# Patient Record
Sex: Female | Born: 1980 | Hispanic: Yes | Marital: Single | Smoking: Never smoker
Health system: Southern US, Community
[De-identification: ages and names within clinical notes are randomized; demographics above are authoritative.]

## PROBLEM LIST (undated history)

## (undated) DIAGNOSIS — E785 Hyperlipidemia, unspecified: Secondary | ICD-10-CM

## (undated) HISTORY — DX: Hyperlipidemia, unspecified: E78.5

---

## 2009-04-30 ENCOUNTER — Ambulatory Visit: Payer: Self-pay | Admitting: Family Medicine

## 2009-09-23 ENCOUNTER — Inpatient Hospital Stay: Payer: Self-pay | Admitting: Obstetrics and Gynecology

## 2009-10-18 ENCOUNTER — Ambulatory Visit: Payer: Self-pay | Admitting: Family Medicine

## 2009-10-24 ENCOUNTER — Ambulatory Visit: Payer: Self-pay

## 2009-11-06 ENCOUNTER — Ambulatory Visit: Payer: Self-pay | Admitting: General Surgery

## 2009-11-08 ENCOUNTER — Ambulatory Visit: Payer: Self-pay | Admitting: General Surgery

## 2010-07-11 ENCOUNTER — Ambulatory Visit: Payer: Self-pay | Admitting: Family

## 2010-11-29 ENCOUNTER — Ambulatory Visit: Payer: Self-pay | Admitting: Family

## 2010-12-07 ENCOUNTER — Inpatient Hospital Stay: Payer: Self-pay | Admitting: Obstetrics and Gynecology

## 2012-01-20 ENCOUNTER — Ambulatory Visit: Payer: Self-pay

## 2016-09-15 NOTE — L&D Delivery Note (Signed)
Delivery Note At 7:21 AM a viable and female  was delivered via Vaginal, Spontaneous Delivery (Presentation: vtx/ ROA;  ).  APGAR: 9, 9; weight 9 lb 3.1 oz (4170 g).   Placenta status: , .  Cord:  with the following complications: .    Anesthesia:stadol  Episiotomy:  None  Lacerations:  None  Suture Repair: n/a Est. Blood Loss (mL):  200cc  Mom to postpartum.  Baby to Couplet care / Skin to Skin.  Ihor Austinhomas J Sokhna Christoph 07/02/2017, 7:42 AM

## 2016-12-25 ENCOUNTER — Other Ambulatory Visit: Payer: Self-pay | Admitting: Primary Care

## 2016-12-25 DIAGNOSIS — Z3482 Encounter for supervision of other normal pregnancy, second trimester: Secondary | ICD-10-CM

## 2016-12-31 ENCOUNTER — Encounter: Payer: Self-pay | Admitting: Radiology

## 2016-12-31 ENCOUNTER — Ambulatory Visit
Admission: RE | Admit: 2016-12-31 | Discharge: 2016-12-31 | Disposition: A | Discharge status: Home / Self Care | Payer: Self-pay | Source: Ambulatory Visit | Attending: Primary Care | Admitting: Primary Care

## 2016-12-31 ENCOUNTER — Other Ambulatory Visit: Payer: Self-pay | Admitting: Primary Care

## 2016-12-31 DIAGNOSIS — Z3A17 17 weeks gestation of pregnancy: Secondary | ICD-10-CM | POA: Insufficient documentation

## 2016-12-31 DIAGNOSIS — Z3482 Encounter for supervision of other normal pregnancy, second trimester: Secondary | ICD-10-CM | POA: Insufficient documentation

## 2017-07-02 ENCOUNTER — Inpatient Hospital Stay
Admission: EM | Admit: 2017-07-02 | Discharge: 2017-07-04 | DRG: 807 | Disposition: A | Discharge status: Home / Self Care | Payer: Medicaid Other | Attending: Obstetrics and Gynecology | Admitting: Obstetrics and Gynecology

## 2017-07-02 DIAGNOSIS — O479 False labor, unspecified: Secondary | ICD-10-CM | POA: Diagnosis present

## 2017-07-02 DIAGNOSIS — Z3A40 40 weeks gestation of pregnancy: Secondary | ICD-10-CM

## 2017-07-02 DIAGNOSIS — O99824 Streptococcus B carrier state complicating childbirth: Principal | ICD-10-CM | POA: Diagnosis present

## 2017-07-02 DIAGNOSIS — O26893 Other specified pregnancy related conditions, third trimester: Secondary | ICD-10-CM | POA: Diagnosis present

## 2017-07-02 LAB — CBC WITH DIFFERENTIAL/PLATELET
BASOS ABS: 0 10*3/uL (ref 0–0.1)
Basophils Relative: 0 %
EOS ABS: 0.1 10*3/uL (ref 0–0.7)
EOS PCT: 1 %
HCT: 41.8 % (ref 35.0–47.0)
Hemoglobin: 14.1 g/dL (ref 12.0–16.0)
Lymphocytes Relative: 29 %
Lymphs Abs: 2.8 10*3/uL (ref 1.0–3.6)
MCH: 30.8 pg (ref 26.0–34.0)
MCHC: 33.8 g/dL (ref 32.0–36.0)
MCV: 91.1 fL (ref 80.0–100.0)
Monocytes Absolute: 0.8 10*3/uL (ref 0.2–0.9)
Monocytes Relative: 8 %
Neutro Abs: 6.1 10*3/uL (ref 1.4–6.5)
Neutrophils Relative %: 62 %
PLATELETS: 168 10*3/uL (ref 150–440)
RBC: 4.6 MIL/uL (ref 3.80–5.20)
RDW: 14.4 % (ref 11.5–14.5)
WBC: 9.8 10*3/uL (ref 3.6–11.0)

## 2017-07-02 LAB — TYPE AND SCREEN
ABO/RH(D): O POS
Antibody Screen: NEGATIVE

## 2017-07-02 MED ORDER — ACETAMINOPHEN 325 MG PO TABS
650.0000 mg | ORAL_TABLET | ORAL | Status: DC | PRN
  Administered 2017-07-02 – 2017-07-03 (×4): 650 mg via ORAL
  Filled 2017-07-02 (×2): qty 2

## 2017-07-02 MED ORDER — PRENATAL MULTIVITAMIN CH
1.0000 | ORAL_TABLET | Freq: Every day | ORAL | Status: DC
  Administered 2017-07-02 – 2017-07-03 (×2): 1 via ORAL
  Filled 2017-07-02 (×2): qty 1

## 2017-07-02 MED ORDER — LIDOCAINE HCL (PF) 1 % IJ SOLN
INTRAMUSCULAR | Status: AC
  Filled 2017-07-02: qty 30

## 2017-07-02 MED ORDER — ACETAMINOPHEN 325 MG PO TABS
650.0000 mg | ORAL_TABLET | ORAL | Status: DC | PRN
  Filled 2017-07-02 (×2): qty 2

## 2017-07-02 MED ORDER — WITCH HAZEL-GLYCERIN EX PADS
1.0000 | MEDICATED_PAD | CUTANEOUS | Status: DC | PRN
Start: 2017-07-02 — End: 2017-07-04

## 2017-07-02 MED ORDER — SODIUM CHLORIDE 0.9 % IV SOLN
2.0000 g | Freq: Once | INTRAVENOUS | Status: AC
  Administered 2017-07-02: 2 g via INTRAVENOUS

## 2017-07-02 MED ORDER — BUTORPHANOL TARTRATE 1 MG/ML IJ SOLN
1.0000 mg | INTRAMUSCULAR | Status: DC | PRN
  Administered 2017-07-02: 1 mg via INTRAVENOUS
  Filled 2017-07-02: qty 1

## 2017-07-02 MED ORDER — SODIUM CHLORIDE 0.9 % IV SOLN
1.0000 g | INTRAVENOUS | Status: DC
Start: 2017-07-02 — End: 2017-07-02
  Filled 2017-07-02 (×4): qty 1000

## 2017-07-02 MED ORDER — OXYTOCIN BOLUS FROM INFUSION
500.0000 mL | Freq: Once | INTRAVENOUS | Status: AC
  Administered 2017-07-02: 500 mL via INTRAVENOUS

## 2017-07-02 MED ORDER — DIPHENHYDRAMINE HCL 25 MG PO CAPS: 25.0000 mg | ORAL_CAPSULE | Freq: Four times a day (QID) | ORAL | Status: DC | PRN

## 2017-07-02 MED ORDER — ZOLPIDEM TARTRATE 5 MG PO TABS: 5.0000 mg | ORAL_TABLET | Freq: Every evening | ORAL | Status: DC | PRN

## 2017-07-02 MED ORDER — LACTATED RINGERS IV SOLN
INTRAVENOUS | Status: DC
  Administered 2017-07-02: 04:00:00 via INTRAVENOUS

## 2017-07-02 MED ORDER — AMMONIA AROMATIC IN INHA
RESPIRATORY_TRACT | Status: AC
  Filled 2017-07-02: qty 10

## 2017-07-02 MED ORDER — MEASLES, MUMPS & RUBELLA VAC ~~LOC~~ INJ
0.5000 mL | INJECTION | Freq: Once | SUBCUTANEOUS | Status: DC
  Filled 2017-07-02: qty 0.5

## 2017-07-02 MED ORDER — BENZOCAINE-MENTHOL 20-0.5 % EX AERO: 1.0000 "application " | INHALATION_SPRAY | CUTANEOUS | Status: DC | PRN

## 2017-07-02 MED ORDER — DIBUCAINE 1 % RE OINT: 1.0000 "application " | TOPICAL_OINTMENT | RECTAL | Status: DC | PRN

## 2017-07-02 MED ORDER — IBUPROFEN 600 MG PO TABS
600.0000 mg | ORAL_TABLET | Freq: Four times a day (QID) | ORAL | Status: DC
  Administered 2017-07-02 (×2): 600 mg via ORAL
  Filled 2017-07-02 (×3): qty 1

## 2017-07-02 MED ORDER — BUTORPHANOL TARTRATE 1 MG/ML IJ SOLN: 1.0000 mg | INTRAMUSCULAR | Status: DC | PRN

## 2017-07-02 MED ORDER — SENNOSIDES-DOCUSATE SODIUM 8.6-50 MG PO TABS
2.0000 | ORAL_TABLET | ORAL | Status: DC
  Administered 2017-07-03 – 2017-07-04 (×2): 2 via ORAL
  Filled 2017-07-02 (×2): qty 2

## 2017-07-02 MED ORDER — ONDANSETRON HCL 4 MG/2ML IJ SOLN: 4.0000 mg | INTRAMUSCULAR | Status: DC | PRN

## 2017-07-02 MED ORDER — OXYTOCIN 10 UNIT/ML IJ SOLN
INTRAMUSCULAR | Status: AC
  Filled 2017-07-02: qty 2

## 2017-07-02 MED ORDER — COCONUT OIL OIL
1.0000 "application " | TOPICAL_OIL | Status: DC | PRN
  Filled 2017-07-02: qty 120

## 2017-07-02 MED ORDER — IBUPROFEN 600 MG PO TABS
ORAL_TABLET | ORAL | Status: AC
  Administered 2017-07-02: 600 mg
  Filled 2017-07-02: qty 1

## 2017-07-02 MED ORDER — ONDANSETRON HCL 4 MG PO TABS: 4.0000 mg | ORAL_TABLET | ORAL | Status: DC | PRN

## 2017-07-02 MED ORDER — OXYTOCIN 40 UNITS IN LACTATED RINGERS INFUSION - SIMPLE MED
2.5000 [IU]/h | INTRAVENOUS | Status: DC
  Administered 2017-07-02: 2.5 [IU]/h via INTRAVENOUS
  Filled 2017-07-02: qty 1000

## 2017-07-02 MED ORDER — MISOPROSTOL 200 MCG PO TABS
ORAL_TABLET | ORAL | Status: AC
  Filled 2017-07-02: qty 4

## 2017-07-02 MED ORDER — MAGNESIUM HYDROXIDE 400 MG/5ML PO SUSP: 30.0000 mL | ORAL | Status: DC | PRN

## 2017-07-02 MED ORDER — SODIUM CHLORIDE 0.9 % IV SOLN
INTRAVENOUS | Status: AC
  Administered 2017-07-02: 2 g via INTRAVENOUS
  Filled 2017-07-02: qty 2000

## 2017-07-02 MED ORDER — LACTATED RINGERS IV SOLN: 500.0000 mL | INTRAVENOUS | Status: DC | PRN

## 2017-07-02 MED ORDER — FERROUS SULFATE 325 (65 FE) MG PO TABS
325.0000 mg | ORAL_TABLET | Freq: Two times a day (BID) | ORAL | Status: DC
  Administered 2017-07-02 – 2017-07-04 (×4): 325 mg via ORAL
  Filled 2017-07-02 (×4): qty 1

## 2017-07-02 MED ORDER — SOD CITRATE-CITRIC ACID 500-334 MG/5ML PO SOLN: 30.0000 mL | ORAL | Status: DC | PRN

## 2017-07-02 MED ORDER — LIDOCAINE HCL (PF) 1 % IJ SOLN: 30.0000 mL | INTRAMUSCULAR | Status: DC | PRN

## 2017-07-02 MED ORDER — LACTATED RINGERS IV SOLN: INTRAVENOUS | Status: DC

## 2017-07-02 MED ORDER — ONDANSETRON HCL 4 MG/2ML IJ SOLN: 4.0000 mg | Freq: Four times a day (QID) | INTRAMUSCULAR | Status: DC | PRN

## 2017-07-02 MED ORDER — SIMETHICONE 80 MG PO CHEW: 80.0000 mg | CHEWABLE_TABLET | ORAL | Status: DC | PRN

## 2017-07-02 NOTE — Progress Notes (Signed)
Pt reported having some pain at her right wrist; interpreter present; pt reports some pain, tingling and occasional numbing to right wrist; this has been happening since before pregnancy; RN encouraged pt to not lay baby in crook of right arm for too long incase this is making that numbing feel worse; RN encouraged pt to prop hand on pillow; RN did say that if her wrist was bothering her before pregnancy, picking up her 9lb baby may cause some irritation and some pain to the area; RN encouraged pt to let us know if this gets worse

## 2017-07-02 NOTE — Discharge Summary (Signed)
  Obstetric Discharge Summary   Patient ID: Patient Name: Joanna Pham DOB: [birth year] MRN: [id number]  Date of Admission: 07/02/2017 Date of Discharge: 07/04/17  Primary OB: Phineas Realharles Drew Gestational Age at Delivery: [id number]   Antepartum complications: none Admitting Diagnosis: labor  Secondary Diagnoses: Patient Active Problem List   Diagnosis Date Noted  . Uterine contractions during pregnancy 07/02/2017    Augmentation: None Complications: None Intrapartum complications/course: uncomplicatede feamle delivery SVD   Date of Delivery: 07/04/2017 @0721   Delivered YQ:[id number]:Schermerhorn MD Delivery Type: spontaneous vaginal delivery Anesthesia: epidural Placenta: sponatneous Laceration:  Episiotomy: none  Newborn Data: Live born unspecified sex  Birth Weight: 9 lb 3.1 oz (4170 g) APGAR: 9, 9  Newborn Delivery   Birth date/time:  07/02/2017 07:21:00 Delivery type:  Vaginal, Spontaneous Delivery          Postpartum Course  Patient had an uncomplicated postpartum course.  By time of discharge on PPD#2 her pain was controlled on oral pain medications; she had appropriate lochia and was ambulating, voiding without difficulty and tolerating regular diet.  She was deemed stable for discharge to home.     Labs: CBC Latest Ref Rng & Units 07/03/2017 07/02/2017  WBC 3.6 - 11.0 K/uL 10.2 9.8  Hemoglobin 12.0 - 16.0 g/dL 84.112.5 32.414.1  Hematocrit 40.135.0 - 47.0 % 36.8 41.8  Platelets 150 - 440 K/uL 146(L) 168   O POS  Physical exam:  BP 105/60 (BP Location: Left Arm)   Pulse (!) 58   Temp 98.4 F (36.9 C) (Oral)   Resp 18   Ht 5\' 4"  (1.626 m)   Wt 78 kg (172 lb)   SpO2 99%   Breastfeeding? Unknown   BMI 29.52 kg/m  General: alert and no distress Pulm: normal respiratory effort Lochia: appropriate Abdomen: soft, NT Uterine Fundus: firm, below umbilicus Extremities: No evidence of DVT seen on physical exam. No lower extremity edema.   Disposition: stable,  discharge to home Baby Feeding: breastmilk and formula Baby Disposition: home with mom Prenatal Labs:   ABO, Rh: --/--/O POS (10/18 [id number]) Antibody: NEG (10/18 0349) Rubella:  imm Varicella imm  RPR:   neg HBsAg:  neg  HIV:   neg GBS:   Positive    Plan:  Joanna Pham was discharged to home in good condition. [facility] weeks for postpartum visit  Discharge Instructions: Per After Visit Summary. Activity: Advance as tolerated. Pelvic rest for 6 weeks.  Refer to After Visit Summary Diet: Regular Discharge Medications: Allergies as of 07/04/2017   No Known Allergies     Medication List    TAKE these medications   acetaminophen 325 MG tablet Commonly known as:  TYLENOL Take 650 mg by mouth every 6 (six) hours as needed.   docusate sodium 100 MG capsule Commonly known as:  COLACE Take 1 capsule (100 mg total) by mouth 2 (two) times daily. To keep stools soft, as needed   ferrous sulfate 325 (65 FE) MG tablet Take 1 tablet (325 mg total) by mouth daily with breakfast. Take with Vitamin C   ibuprofen 800 MG tablet Commonly known as:  ADVIL,MOTRIN Take 1 tablet (800 mg total) by mouth every 8 (eight) hours as needed for moderate pain or cramping.   prenatal multivitamin Tabs tablet Take 1 tablet by mouth daily at 12 noon.      Outpatient follow up:    Signed:  Christeen DouglasBethany Marra Fraga  07/04/17

## 2017-07-02 NOTE — OB Triage Note (Signed)
Recvd pt from ED. Interpreter on a stick at bedside.  Pt c/o contractions that started around 1900 last night that are 5-6 min apart. No LOF but having more mucousy discharge. Felling baby move ok.

## 2017-07-02 NOTE — H&P (Signed)
Joanna Pham is a 36 y.o. female presenting foractive labor at 40+5 cm  OB History    Gravida Para Term Preterm AB Living   4 3       3    SAB TAB Ectopic Multiple Live Births                 History reviewed. No pertinent past medical history.dental caries  History reviewed. No pertinent surgical history.breast abscess  Family History: family history is not on file. Social History:  reports that she has never smoked. She has never used smokeless tobacco. She reports that she does not drink alcohol or use drugs.     Maternal Diabetes: No Genetic Screening: Declined Maternal Ultrasounds/Referrals: Abnormal:  Findings:   Other:initial marginal previa resolved  Fetal Ultrasounds or other Referrals:  None Maternal Substance Abuse:  No Significant Maternal Medications:  None Significant Maternal Lab Results:  None Other Comments:  None  ROS History Dilation: 8 Effacement (%): 100 Station: +1 Exam by:: Joanna Pham Blood pressure 133/84, pulse 80, temperature 97.9 F (36.6 C), temperature source Oral, resp. rate 16, height 5\' 4"  (1.626 m), weight 78 kg (172 lb). Exam Physical Exam   lungs CTA  CV RRR  Abd: gravid  Pelvic Exam as above   EFM : reassuring  Prenatal labs: ABO, Rh: --/--/O POS (10/18 11910349) Antibody: NEG (10/18 0349) Rubella:  imm Varicella imm  RPR:   neg HBsAg:  neg  HIV:   neg GBS:   Positive   Assessment/Plan: Active labor  Reassuring fetal monitoring  Anticipate SVD shorlty   Ihor Austinhomas J Denielle Bayard 07/02/2017, 6:15 AM

## 2017-07-03 LAB — CBC
HCT: 36.8 % (ref 35.0–47.0)
HEMOGLOBIN: 12.5 g/dL (ref 12.0–16.0)
MCH: 31.4 pg (ref 26.0–34.0)
MCHC: 34 g/dL (ref 32.0–36.0)
MCV: 92.2 fL (ref 80.0–100.0)
PLATELETS: 146 10*3/uL — AB (ref 150–440)
RBC: 4 MIL/uL (ref 3.80–5.20)
RDW: 14.5 % (ref 11.5–14.5)
WBC: 10.2 10*3/uL (ref 3.6–11.0)

## 2017-07-03 LAB — RPR: RPR Ser Ql: NONREACTIVE

## 2017-07-03 MED ORDER — IBUPROFEN 600 MG PO TABS
600.0000 mg | ORAL_TABLET | Freq: Four times a day (QID) | ORAL | Status: DC
  Administered 2017-07-03 – 2017-07-04 (×4): 600 mg via ORAL
  Filled 2017-07-03 (×5): qty 1

## 2017-07-03 NOTE — Lactation Note (Signed)
This note was copied from a baby's chart. Lactation Consultation Note  Patient Name: Girl Doristine ChurchDalia Gutierrez Pham RUEAV'WToday's Date: 07/03/2017 Reason for consult: Follow-up assessment;Difficult latch   Maternal Data Formula Feeding for Exclusion: No Has patient been taught Hand Expression?: Yes Does the patient have breastfeeding experience prior to this delivery?: Yes  Feeding Feeding Type: Breast Fed Length of feed: 40 min (right breast) Baby sucking on tongue, not opening mouth well, gaggy, unable to latch to left breast,despite using electric symphony breast pump to pull out nipple,  unable to sustain latch on  right   LATCH Score Latch: Too sleepy or reluctant, no latch achieved, no sucking elicited.  Audible Swallowing: A few with stimulation  Type of Nipple: Flat (left flat and sl. inverted )  Comfort (Breast/Nipple): Soft / non-tender  Hold (Positioning): No assistance needed to correctly position infant at breast.  LATCH Score: 7  Interventions Interventions: Assisted with latch;Pre-pump if needed;Breast compression;Adjust position;Support pillows;Position options;DEBP  Lactation Tools Discussed/Used Tools: Pump WIC Program: Yes   Consult Status Consult Status: Follow-up Date: 07/03/17 Follow-up type: In-patient Will attempt using nipple shield at next feeding and pumping with spanish interpreter present, pt desires feeding formula at this feeding   Dyann KiefMarsha D Breely Panik 07/03/2017, 2:19 PM

## 2017-07-04 MED ORDER — IBUPROFEN 800 MG PO TABS: 800.0000 mg | ORAL_TABLET | Freq: Three times a day (TID) | ORAL | 1 refills | Status: AC | PRN

## 2017-07-04 MED ORDER — DOCUSATE SODIUM 100 MG PO CAPS: 100.0000 mg | ORAL_CAPSULE | Freq: Two times a day (BID) | ORAL | 0 refills | Status: AC

## 2017-07-04 MED ORDER — FERROUS SULFATE 325 (65 FE) MG PO TABS: 325.0000 mg | ORAL_TABLET | Freq: Every day | ORAL | 1 refills | Status: AC

## 2017-07-04 NOTE — Progress Notes (Signed)
Post Partum Day 1 Subjective: Doing well, no complaints.  Tolerating regular diet, pain with PO meds, voiding and ambulating without difficulty.  No CP SOB F/C N/V or leg pain   Objective: BP 103/71 (BP Location: Left Arm)   Pulse 73   Temp 98.2 F (36.8 C) (Oral)   Resp 18   Ht 5\' 4"  (1.626 m)   Wt 78 kg (172 lb)   SpO2 100%   Breastfeeding? Unknown   BMI 29.52 kg/m    Physical Exam:  General: NAD CV: RRR Pulm: nl effort, CTABL Lochia: moderate Uterine Fundus: fundus firm and below umbilicus DVT Evaluation: no cords, ttp LEs    Recent Labs  07/02/17 0349 07/03/17 0557  HGB 14.1 12.5  HCT 41.8 36.8  WBC 9.8 10.2  PLT 168 146*    Assessment/Plan: 36 y.o. G4P1004 postpartum day # 1  1. Doing well, continue routine post partum care   ----- Ranae Plumberhelsea Shyne Lehrke, MD Attending Obstetrician and Gynecologist [facility]

## 2017-07-04 NOTE — Progress Notes (Signed)
D/C instructions provided, pt states understanding, aware of follow up appt.  Prescription given to pt.   

## 2017-07-04 NOTE — Progress Notes (Signed)
D/C home to car via auxiliary in wheelchair.  

## 2017-07-04 NOTE — Progress Notes (Signed)
Pt watching Period of Purple Cry. 

## 2018-01-12 ENCOUNTER — Emergency Department
Admission: EM | Admit: 2018-01-12 | Discharge: 2018-01-12 | Disposition: A | Discharge status: Home / Self Care | Payer: Self-pay | Attending: Emergency Medicine | Admitting: Emergency Medicine

## 2018-01-12 ENCOUNTER — Emergency Department: Payer: Self-pay

## 2018-01-12 ENCOUNTER — Other Ambulatory Visit: Payer: Self-pay

## 2018-01-12 ENCOUNTER — Encounter: Payer: Self-pay | Admitting: Emergency Medicine

## 2018-01-12 DIAGNOSIS — J019 Acute sinusitis, unspecified: Secondary | ICD-10-CM

## 2018-01-12 DIAGNOSIS — Z79899 Other long term (current) drug therapy: Secondary | ICD-10-CM | POA: Insufficient documentation

## 2018-01-12 DIAGNOSIS — M940 Chondrocostal junction syndrome [Tietze]: Secondary | ICD-10-CM

## 2018-01-12 DIAGNOSIS — J40 Bronchitis, not specified as acute or chronic: Secondary | ICD-10-CM

## 2018-01-12 LAB — BASIC METABOLIC PANEL
Anion gap: 7 (ref 5–15)
BUN: 8 mg/dL (ref 6–20)
CHLORIDE: 106 mmol/L (ref 101–111)
CO2: 25 mmol/L (ref 22–32)
Calcium: 8.6 mg/dL — ABNORMAL LOW (ref 8.9–10.3)
Creatinine, Ser: 0.59 mg/dL (ref 0.44–1.00)
GFR calc non Af Amer: >60 mL/min (ref >60)
Glucose, Bld: 98 mg/dL (ref 65–99)
POTASSIUM: 3.8 mmol/L (ref 3.5–5.1)
SODIUM: 138 mmol/L (ref 135–145)

## 2018-01-12 LAB — CBC
HCT: 41.9 % (ref 35.0–47.0)
HEMOGLOBIN: 14.5 g/dL (ref 12.0–16.0)
MCH: 31.2 pg (ref 26.0–34.0)
MCHC: 34.6 g/dL (ref 32.0–36.0)
MCV: 89.9 fL (ref 80.0–100.0)
Platelets: 374 10*3/uL (ref 150–440)
RBC: 4.66 MIL/uL (ref 3.80–5.20)
RDW: 13 % (ref 11.5–14.5)
WBC: 8.7 10*3/uL (ref 3.6–11.0)

## 2018-01-12 LAB — TROPONIN I

## 2018-01-12 MED ORDER — AMOXICILLIN-POT CLAVULANATE 875-125 MG PO TABS: 1.0000 | ORAL_TABLET | Freq: Two times a day (BID) | ORAL | 0 refills | Status: AC

## 2018-01-12 MED ORDER — HYDROCOD POLST-CPM POLST ER 10-8 MG/5ML PO SUER: 5.0000 mL | Freq: Once | ORAL | Status: DC

## 2018-01-12 MED ORDER — SODIUM CHLORIDE 0.9 % IV BOLUS
1000.0000 mL | Freq: Once | INTRAVENOUS | Status: AC
  Administered 2018-01-12: 1000 mL via INTRAVENOUS

## 2018-01-12 MED ORDER — IPRATROPIUM-ALBUTEROL 0.5-2.5 (3) MG/3ML IN SOLN
3.0000 mL | Freq: Once | RESPIRATORY_TRACT | Status: AC
  Administered 2018-01-12: 3 mL via RESPIRATORY_TRACT
  Filled 2018-01-12: qty 3

## 2018-01-12 MED ORDER — HYDROCOD POLST-CPM POLST ER 10-8 MG/5ML PO SUER: 5.0000 mL | Freq: Two times a day (BID) | ORAL | 0 refills | Status: AC

## 2018-01-12 MED ORDER — PREDNISONE 10 MG (21) PO TBPK: ORAL_TABLET | ORAL | 0 refills | Status: DC

## 2018-01-12 MED ORDER — PREDNISONE 20 MG PO TABS
60.0000 mg | ORAL_TABLET | Freq: Once | ORAL | Status: AC
  Administered 2018-01-12: 60 mg via ORAL
  Filled 2018-01-12: qty 3

## 2018-01-12 NOTE — ED Notes (Signed)
Patient transported to X-ray 

## 2018-01-12 NOTE — ED Notes (Signed)
Interpreter paged for discharge 

## 2018-01-12 NOTE — ED Provider Notes (Signed)
[facility] Emergency Department Provider Note   ____________________________________________   First MD Initiated Contact with Patient 01/12/18 [id number]     (approximate)  I have reviewed the triage vital signs and the nursing notes.   HISTORY  Chief Complaint Cough    HPI Joanna Pham is a 37 y.o. female who comes into the hospital today with some chest and back pain.  The patient has had the symptoms for 3 days.  The patient states that the pain is worse when she lays down.  She was given albuterol after going to her doctor's office 2 days ago.  The patient was also given Flonase and cetirizine/ Sudafed.  The patient reports that she was told her cough is due to allergies.  She does not have any fevers.  She is also had some pain in her face by her sinuses and some green mucus coming out of her nose since 2 days ago.  The patient denies any sick contacts.  She rates her pain 8 out of 10 in intensity.  The patient denies nausea or vomiting.  She is here today for evaluation.   History reviewed. No pertinent past medical history.  Patient Active Problem List   Diagnosis Date Noted  . Uterine contractions during pregnancy 07/02/2017    History reviewed. No pertinent surgical history.  Prior to Admission medications   Medication Sig Start Date End Date Taking? Authorizing Provider  acetaminophen (TYLENOL) 325 MG tablet Take 650 mg by mouth every 6 (six) hours as needed.    [provider]  amoxicillin-clavulanate (AUGMENTIN) 875-125 MG tablet Take 1 tablet by mouth 2 (two) times daily for 10 days. 01/12/18 01/22/18  Rebecka Apley, MD  ferrous sulfate 325 (65 FE) MG tablet Take 1 tablet (325 mg total) by mouth daily with breakfast. Take with Vitamin C 07/04/17 09/02/17  Christeen Douglas, MD  ibuprofen (ADVIL,MOTRIN) 800 MG tablet Take 1 tablet (800 mg total) by mouth every 8 (eight) hours as needed for moderate pain or cramping. 07/04/17    Christeen Douglas, MD  Prenatal Vit-Fe Fumarate-FA (PRENATAL MULTIVITAMIN) TABS tablet Take 1 tablet by mouth daily at 12 noon.    [provider]    Allergies Patient has no known allergies.  No family history on file.  Social History Social History   Tobacco Use  . Smoking status: Never Smoker  . Smokeless tobacco: Never Used  Substance Use Topics  . Alcohol use: No  . Drug use: No    Review of Systems  Constitutional: No fever/chills Eyes: No visual changes. ENT: Facial pain with runny nose Cardiovascular: chest pain. Respiratory: Cough and shortness of breath. Gastrointestinal: No abdominal pain.  No nausea, no vomiting.  No diarrhea.  No constipation. Genitourinary: Negative for dysuria. Musculoskeletal: Negative for back pain. Skin: Negative for rash. Neurological: Negative for headaches, focal weakness or numbness.   ____________________________________________   PHYSICAL EXAM:  VITAL SIGNS: ED Triage Vitals  Enc Vitals Group     BP 01/12/18 0530 (!) 136/91     Pulse Rate 01/12/18 0530 (!) 106     Resp 01/12/18 0530 18     Temp 01/12/18 0530 99.2 F (37.3 C)     Temp Source 01/12/18 0530 Oral     SpO2 01/12/18 0530 99 %     Weight --      Height 01/12/18 0532 5' (1.524 m)     Head Circumference --      Peak Flow --  Pain Score 01/12/18 0530 0     Pain Loc --      Pain Edu? --      Excl. in GC? --     Constitutional: Alert and oriented. Well appearing and in moderate distress. Eyes: Conjunctivae are normal. PERRL. EOMI. Head: Atraumatic. Nose: No congestion/rhinnorhea. Mouth/Throat: Mucous membranes are moist.  Oropharynx non-erythematous. Cardiovascular: Tachycardia, regular rhythm. Grossly normal heart sounds.  Good peripheral circulation. Respiratory: Normal respiratory effort.  No retractions.  Intermittent expiratory wheezes. Gastrointestinal: Soft and nontender. No distention.  Positive bowel sounds Musculoskeletal: No lower  extremity tenderness nor edema.  . Neurologic:  Normal speech and language.  Skin:  Skin is warm, dry and intact.  Psychiatric: Mood and affect are normal.   ____________________________________________   LABS (all labs ordered are listed, but only abnormal results are displayed)  Labs Reviewed  BASIC METABOLIC PANEL - Abnormal; Notable for the following components:      Result Value   Calcium 8.6 (*)    All other components within normal limits  CBC  TROPONIN I   ____________________________________________  EKG  ED ECG REPORT I, Rebecka Apley, the attending physician, personally viewed and interpreted this ECG.   Date: 01/12/2018  EKG Time: 600  Rate: 90  Rhythm: normal sinus rhythm  Axis: Normal  Intervals:none  ST&T Change: none  ____________________________________________  RADIOLOGY  ED MD interpretation: Chest x-ray: No edema or consolidation  Official radiology report(s): Dg Chest 2 View  Result Date: 01/12/2018 CLINICAL DATA:  Cough and shortness of breath EXAM: CHEST - 2 VIEW COMPARISON:  None. FINDINGS: There is no edema or consolidation. The heart size and pulmonary vascularity are normal. No adenopathy. No bone lesions. IMPRESSION: No edema or consolidation. Electronically Signed   By: Bretta Bang III M.D.   On: 01/12/2018 07:04    ____________________________________________   PROCEDURES  Procedure(s) performed: None  Procedures  Critical Care performed: No  ____________________________________________   INITIAL IMPRESSION / ASSESSMENT AND PLAN / ED COURSE  As part of my medical decision making, I reviewed the following data within the electronic MEDICAL RECORD NUMBER Notes from prior ED visits and [place] [place] Controlled Substance Database   This is a 37 year old female who comes into the hospital today with some chest pain and back pain as well as a cough for the past 3 days.  The patient has had some facial pain and green looking  mucus.  My differential diagnosis includes bronchitis, sinusitis, pericarditis, pneumonia  The patient is mildly tachycardic so I will place an IV and give her a liter of fluid.  I will also check a CBC BMP and a troponin as well as an EKG given her pain that is worse when she lays down.  The patient will receive a chest x-ray. I did order a DuoNeb treatment and steroids as well.  The patient will be reassessed once have received her results.  The patient's blood work is unremarkable.  Her chest x-ray does not show any cardiomegaly or any abnormality.  She does feel improved after receiving medication.  The patient will be discharged home to follow-up with the acute care clinic.      ____________________________________________   FINAL CLINICAL IMPRESSION(S) / ED DIAGNOSES  Final diagnoses:  Bronchitis  Acute non-recurrent sinusitis, unspecified location  Costochondritis     ED Discharge Orders        Ordered    amoxicillin-clavulanate (AUGMENTIN) 875-125 MG tablet  2 times daily     01/12/18  0981       Note:  This document was prepared using Dragon voice recognition software and may include unintentional dictation errors.   Rebecka Apley, MD 01/12/18 [id number]

## 2018-01-12 NOTE — ED Triage Notes (Signed)
Patient ambulatory to triage with steady gait, without difficulty or distress noted; pt reports x 3 days having prod cough white sputum; seen by PCP 2 days ago and rx flonase and zpak without relief

## 2021-05-08 ENCOUNTER — Ambulatory Visit
Admission: RE | Admit: 2021-05-08 | Discharge: 2021-05-08 | Disposition: A | Discharge status: Home / Self Care | Payer: Self-pay | Source: Ambulatory Visit | Attending: Oncology | Admitting: Oncology

## 2021-05-08 ENCOUNTER — Other Ambulatory Visit: Payer: Self-pay

## 2021-05-08 ENCOUNTER — Ambulatory Visit: Payer: Self-pay | Attending: Oncology

## 2021-05-08 ENCOUNTER — Ambulatory Visit: Payer: Self-pay

## 2021-05-08 VITALS — BP 118/73 | HR 97 | Temp 97.7°F | Ht 59.5 in | Wt 139.9 lb

## 2021-05-08 DIAGNOSIS — N63 Unspecified lump in unspecified breast: Secondary | ICD-10-CM

## 2021-05-08 NOTE — Progress Notes (Signed)
  Subjective:     Patient ID: Joanna Pham, female   DOB: 1981-01-30, 40 y.o.   MRN: [id number]  HPI   Review of Systems     Objective:   Physical Exam Chest:  Breasts:    Right: Mass and tenderness present. No swelling, bleeding, inverted nipple, nipple discharge or skin change.     Left: Tenderness present.       Comments: Soft mobile thickening upper outer right breast; bilateral outer cyclical tenderness      Assessment:     40 year old Hispanic patient presents for [facility] clinic visit . Donald Pore from [facility] interpreted exam.  Patient screened, and meets [facility] eligibility.  Patient does not have insurance, Medicare or Medicaid.  Instructed patient on breast self awareness using teach back method .  Palpated a soft mobile mass upper outer right breast.      Risk Assessment     Risk Scores       05/08/2021   Last edited by: Jim Like, RN   5-year risk: 0.7 %   Lifetime risk: 13.2 %            Plan:     Sent for bilateral diagnostic mammogram, and ultrasound.

## 2021-05-13 NOTE — Progress Notes (Signed)
Radiologist reviewed Birads 2 mammogram/ultrasound results with patient.  To return for annual screening mammogram.  Copy to [facility].

## 2021-12-10 IMAGING — MG DIGITAL DIAGNOSTIC BILAT W/ TOMO W/ CAD
6 of 10 series · 6 of 30 positions shown · non-contrast
Comparison: None.
COMPARISON: None.

Addendum:
CLINICAL DATA: History of RIGHT breast surgery for mastitis. RIGHT
pain 1 month ago. Nonfocal bilateral outer breast pain currently.

EXAM:
DIGITAL DIAGNOSTIC BILATERAL MAMMOGRAM WITH TOMOSYNTHESIS AND CAD;
ULTRASOUND RIGHT BREAST LIMITED
TECHNIQUE: Bilateral digital diagnostic mammography and breast tomosynthesis
was performed. The images were evaluated with computer-aided
detection.; Targeted ultrasound examination of the right breast was
performed

[L CC synth-2D]
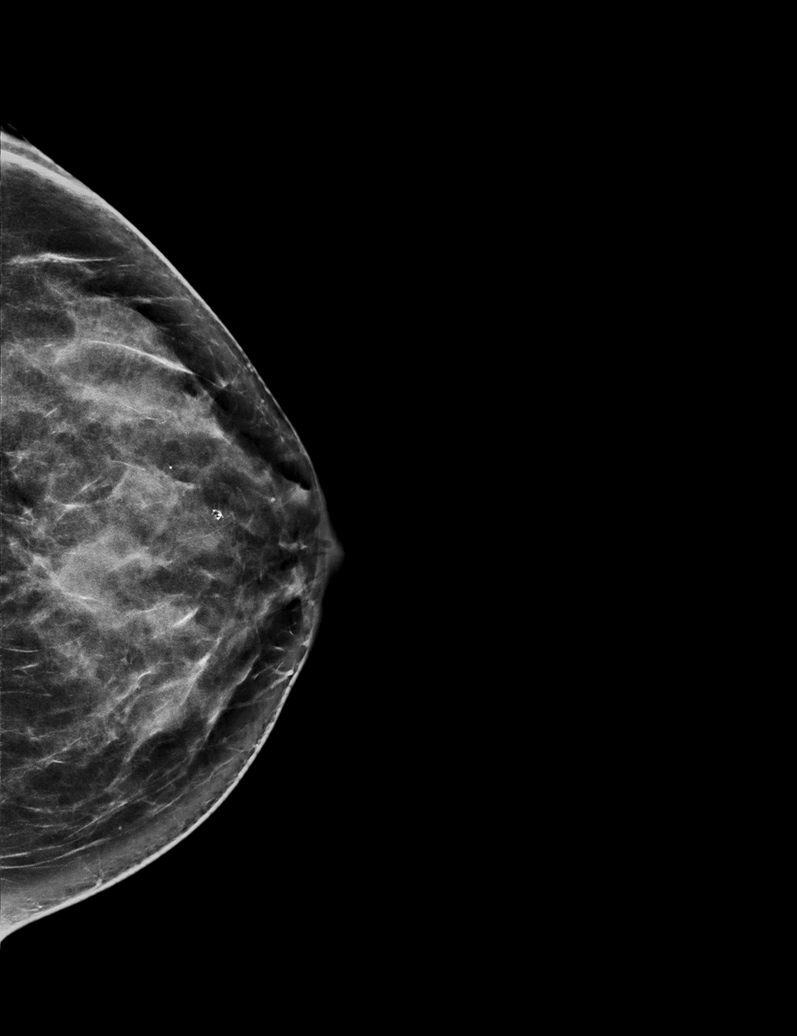

[R MLO synth-2D]
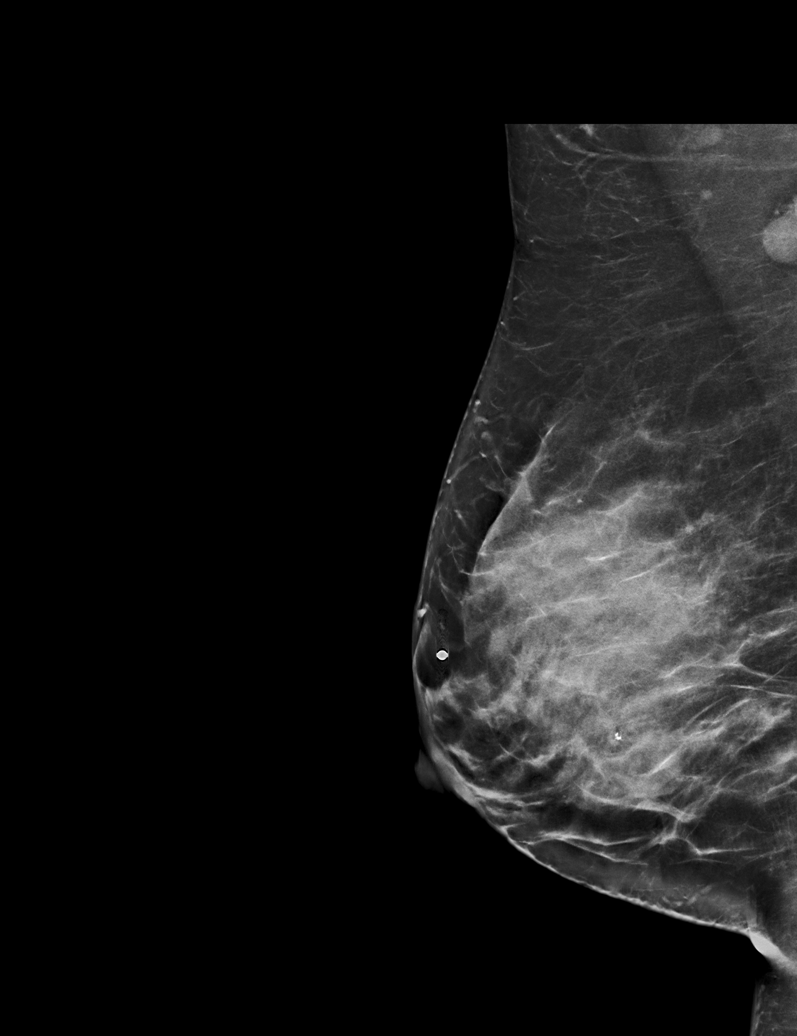

[R TAN synth-2D]
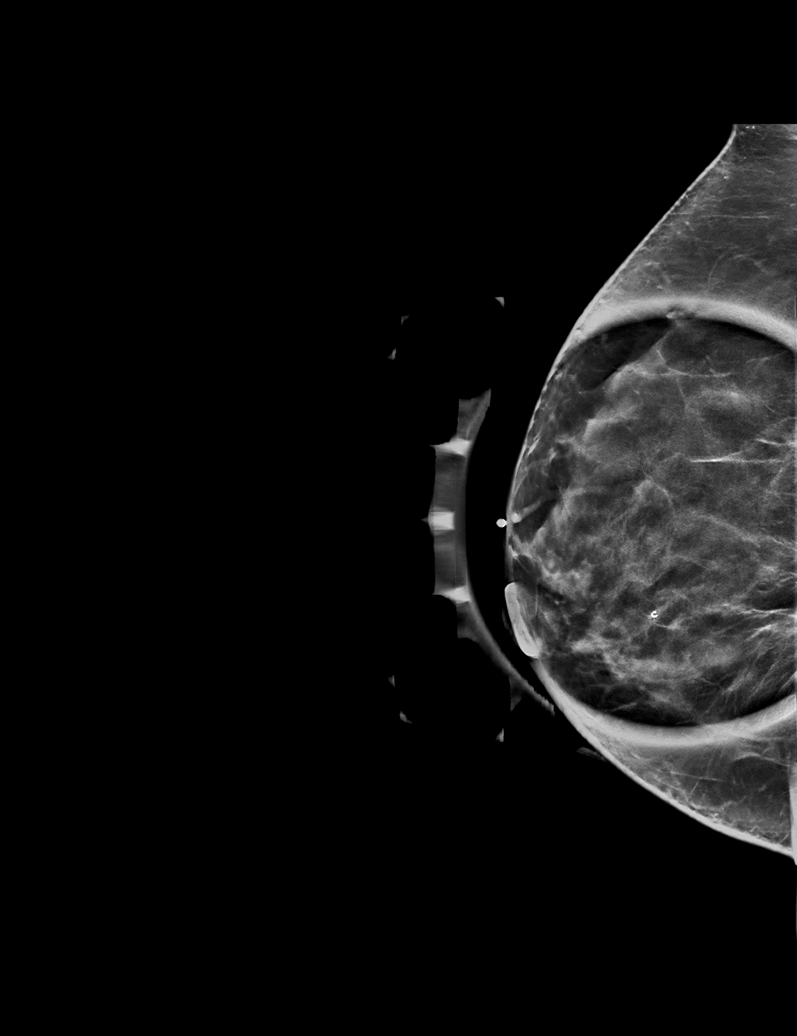

[R CC synth-2D]
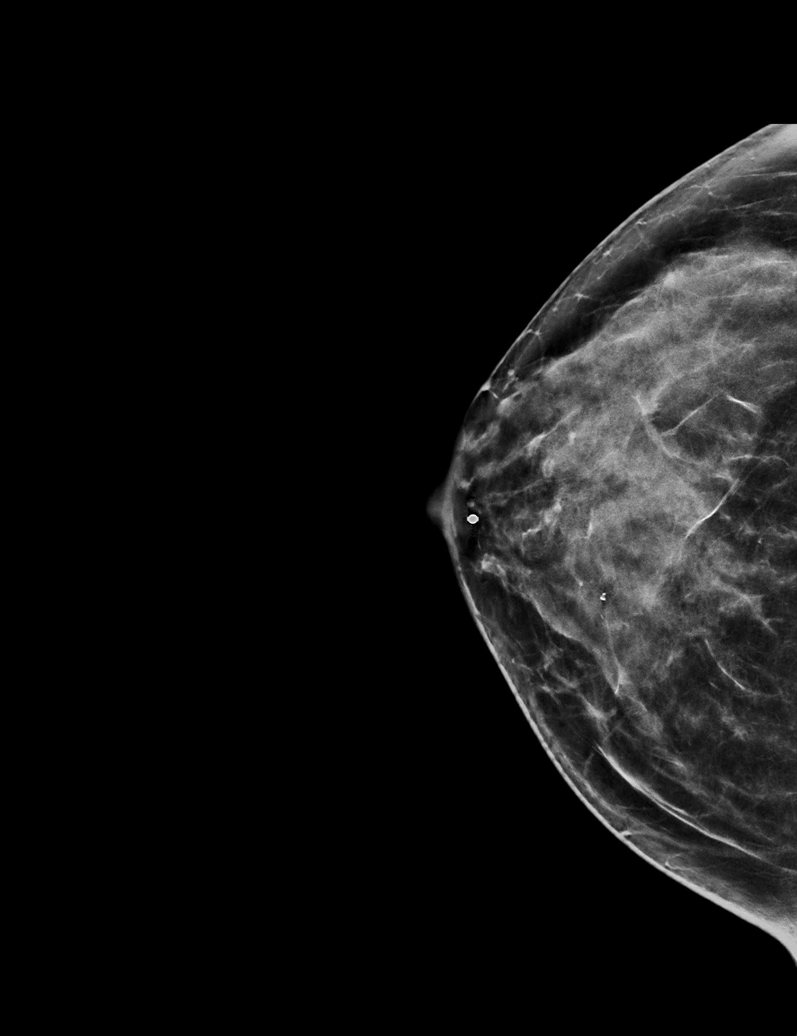

[L MLO synth-2D]
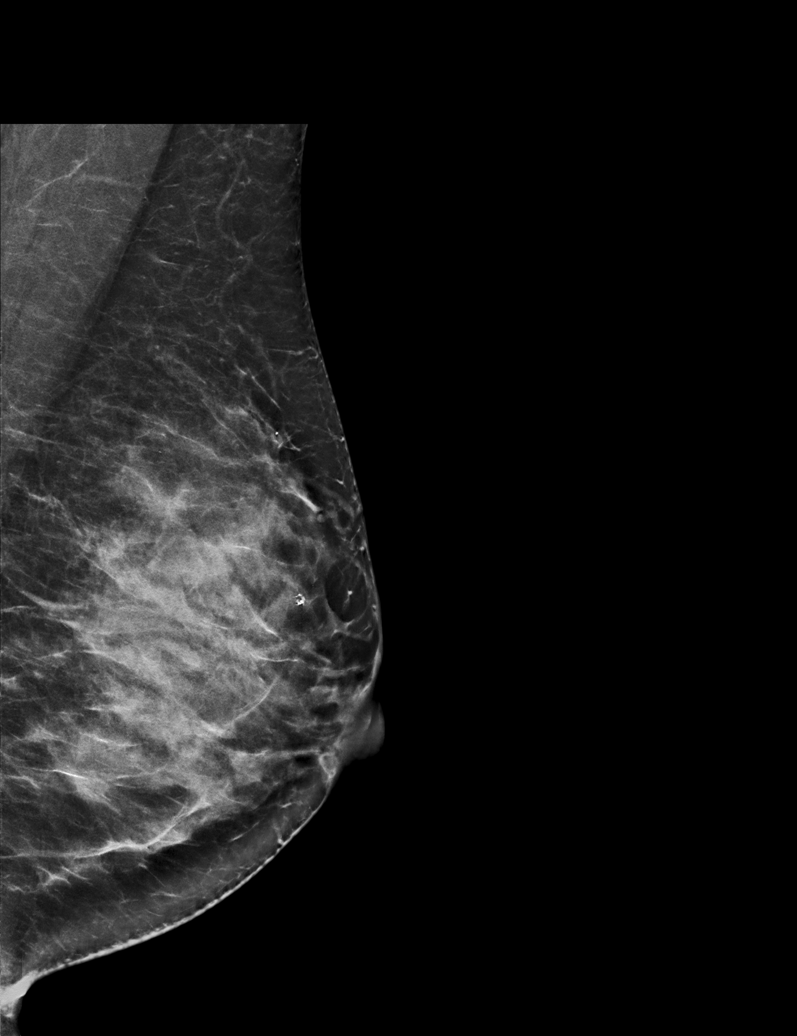

[R TAN tomo · tomo slice 32/63.0]
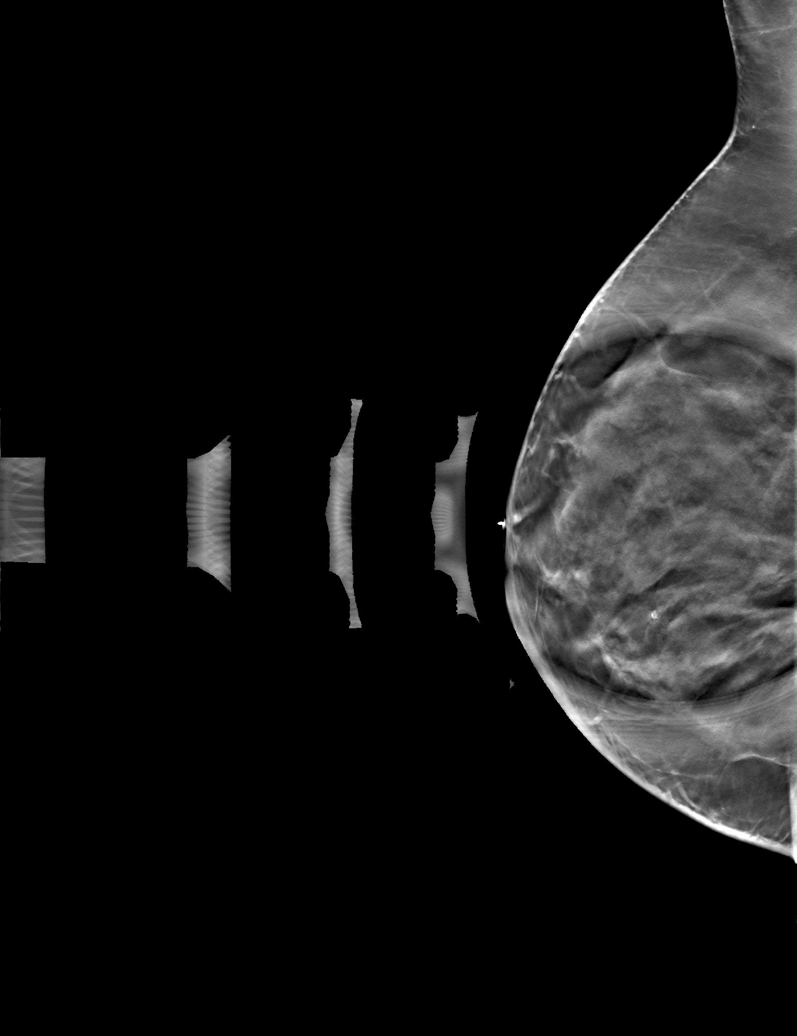

[6 of 30 positions shown; findings below may reference images not displayed]

ACR Breast Density Category c: The breast tissue is heterogeneously
dense, which may obscure small masses.
FINDINGS: Spot compression tomosynthesis views were obtained over the area of
focal pain in the RIGHT breast. No suspicious mammographic finding
is identified in this area. There is a mildly prominent RIGHT
axillary lymph node partially visualized. No suspicious mass,
microcalcification, or other finding is identified in the LEFT
breast.

On physical exam, no suspicious mass is appreciated.

Targeted RIGHT breast ultrasound was performed in the area of pain
at the upper inner breast. No suspicious solid or cystic mass is
identified. No findings to explain the patient's symptoms.

Targeted ultrasound was performed of the right axilla. No suspicious
axillary lymph nodes are seen. Benign RIGHT axillary lymph nodes are
seen with thin smooth cortices and normal echogenic hila
IMPRESSION: 1. No mammographic or sonographic evidence of malignancy at the site
of painful concern in the RIGHT breast. Any further workup of the
patient's symptoms should be based on the clinical assessment.
Recommend routine annual screening mammogram in 1 year.
2. No mammographic evidence of malignancy bilaterally. Benign RIGHT
axillary lymph nodes.

RECOMMENDATION:
Screening mammogram in one year.(Code:33-F-GWC)

I have discussed the findings and recommendations with the patient
and interpreted. If applicable, a reminder letter will be sent to
the patient regarding the next appointment.

BI-RADS CATEGORY  2: Benign.

ADDENDUM:
Prior mammographic images dated January 20, 2012 have been obtained and
reviewed. These demonstrate mammographic stability of a prominent
RIGHT axillary lymph node, consistent with a benign etiology. There
is no significant change to the final impression and recommendation.

BI-RADS: 2: Benign.

*** End of Addendum ***
ACR Breast Density Category c: The breast tissue is heterogeneously
dense, which may obscure small masses.
FINDINGS: Spot compression tomosynthesis views were obtained over the area of
focal pain in the RIGHT breast. No suspicious mammographic finding
is identified in this area. There is a mildly prominent RIGHT
axillary lymph node partially visualized. No suspicious mass,
microcalcification, or other finding is identified in the LEFT
breast.

On physical exam, no suspicious mass is appreciated.

Targeted RIGHT breast ultrasound was performed in the area of pain
at the upper inner breast. No suspicious solid or cystic mass is
identified. No findings to explain the patient's symptoms.

Targeted ultrasound was performed of the right axilla. No suspicious
axillary lymph nodes are seen. Benign RIGHT axillary lymph nodes are
seen with thin smooth cortices and normal echogenic hila
IMPRESSION: 1. No mammographic or sonographic evidence of malignancy at the site
of painful concern in the RIGHT breast. Any further workup of the
patient's symptoms should be based on the clinical assessment.
Recommend routine annual screening mammogram in 1 year.
2. No mammographic evidence of malignancy bilaterally. Benign RIGHT
axillary lymph nodes.

RECOMMENDATION:
Screening mammogram in one year.(Code:33-F-GWC)

I have discussed the findings and recommendations with the patient
and interpreted. If applicable, a reminder letter will be sent to
the patient regarding the next appointment.

BI-RADS CATEGORY  2: Benign.

## 2023-01-01 ENCOUNTER — Encounter: Payer: Self-pay | Admitting: Physician Assistant

## 2023-01-07 ENCOUNTER — Other Ambulatory Visit: Payer: Self-pay

## 2023-01-07 DIAGNOSIS — Z1231 Encounter for screening mammogram for malignant neoplasm of breast: Secondary | ICD-10-CM

## 2023-02-02 ENCOUNTER — Ambulatory Visit
Admission: RE | Admit: 2023-02-02 | Discharge: 2023-02-02 | Disposition: A | Discharge status: Home / Self Care | Payer: Self-pay | Source: Ambulatory Visit | Attending: Obstetrics and Gynecology | Admitting: Obstetrics and Gynecology

## 2023-02-02 ENCOUNTER — Ambulatory Visit: Payer: Self-pay | Attending: Hematology and Oncology | Admitting: Hematology and Oncology

## 2023-02-02 VITALS — BP 135/95 | Wt 146.1 lb

## 2023-02-02 DIAGNOSIS — Z1231 Encounter for screening mammogram for malignant neoplasm of breast: Secondary | ICD-10-CM | POA: Insufficient documentation

## 2023-02-02 NOTE — Patient Instructions (Signed)
Taught Joanna Pham about self breast awareness and gave educational materials to take home. Patient did not need a Pap smear today due to last Pap smear was in 2022 per patient. Let her know [facility] will cover Pap smears every 3 years unless has a history of abnormal Pap smears. Referred patient to the [facility] for screening mammogram. Appointment scheduled for 02/02/23. Patient aware of appointment and will be there. Let patient know will follow up with her within the next couple weeks with results. Joanna Pham verbalized understanding.  Pascal Lux, NP 2:18 PM

## 2023-02-02 NOTE — Progress Notes (Signed)
Ms. Shamyiah Kaeo is a 42 y.o. female who presents to [facility] clinic today with no complaints.    Pap Smear: Pap not smear completed today. Last Pap smear was 05/04/21 at [facility] clinic and was normal. Per patient has no history of an abnormal Pap smear. Last Pap smear result is available in Epic.   Physical exam: Breasts Breasts symmetrical. No skin abnormalities bilateral breasts. No nipple retraction bilateral breasts. No nipple discharge bilateral breasts. No lymphadenopathy. No lumps palpated bilateral breasts.  MS DIGITAL DIAG TOMO BILAT  Addendum Date: 05/08/2021   ADDENDUM REPORT: 05/08/2021 15:07 ADDENDUM: Prior mammographic images dated Jan 20, 2012 have been obtained and reviewed. These demonstrate mammographic stability of a prominent RIGHT axillary lymph node, consistent with a benign etiology. There is no significant change to the final impression and recommendation. BI-RADS: 2: Benign. Electronically Signed   By: Meda Klinefelter M.D.   On: 05/08/2021 15:07   Result Date: 05/08/2021 CLINICAL DATA:  History of RIGHT breast surgery for mastitis. RIGHT pain 1 month ago. Nonfocal bilateral outer breast pain currently. EXAM: DIGITAL DIAGNOSTIC BILATERAL MAMMOGRAM WITH TOMOSYNTHESIS AND CAD; ULTRASOUND RIGHT BREAST LIMITED TECHNIQUE: Bilateral digital diagnostic mammography and breast tomosynthesis was performed. The images were evaluated with computer-aided detection.; Targeted ultrasound examination of the right breast was performed COMPARISON:  None. ACR Breast Density Category c: The breast tissue is heterogeneously dense, which may obscure small masses. FINDINGS: Spot compression tomosynthesis views were obtained over the area of focal pain in the RIGHT breast. No suspicious mammographic finding is identified in this area. There is a mildly prominent RIGHT axillary lymph node partially visualized. No suspicious mass, microcalcification, or other finding is identified in the LEFT  breast. On physical exam, no suspicious mass is appreciated. Targeted RIGHT breast ultrasound was performed in the area of pain at the upper inner breast. No suspicious solid or cystic mass is identified. No findings to explain the patient's symptoms. Targeted ultrasound was performed of the right axilla. No suspicious axillary lymph nodes are seen. Benign RIGHT axillary lymph nodes are seen with thin smooth cortices and normal echogenic hila IMPRESSION: 1. No mammographic or sonographic evidence of malignancy at the site of painful concern in the RIGHT breast. Any further workup of the patient's symptoms should be based on the clinical assessment. Recommend routine annual screening mammogram in 1 year. 2. No mammographic evidence of malignancy bilaterally. Benign RIGHT axillary lymph nodes. RECOMMENDATION: Screening mammogram in one year.(Code:SM-B-01Y) I have discussed the findings and recommendations with the patient and interpreted. If applicable, a reminder letter will be sent to the patient regarding the next appointment. BI-RADS CATEGORY  2: Benign. Electronically Signed: By: Meda Klinefelter M.D. On: 05/08/2021 14:54        Pelvic/Bimanual Pap is not indicated today    Smoking History: Patient has never smoked and was not referred to quit line.    Patient Navigation: Patient education provided. Access to services provided for patient through BCCCP program. Delos Haring interpreter provided. No transportation provided   Colorectal Cancer Screening: Per patient has never had colonoscopy completed No complaints today.    Breast and Cervical Cancer Risk Assessment: Patient has family history of breast cancer, with her mother and grandmother. Patient does not have history of cervical dysplasia, immunocompromised, or DES exposure in-utero.  Risk Assessment   No risk assessment data for the current encounter  Risk Scores       05/08/2021   Last edited by: Jim Like, RN  5-year risk:  0.7 %   Lifetime risk: 13.2 %            A: BCCCP exam without pap smear No complaints with benign exam.   P: [facility] [place] [facility] screening mamm[facility]. Appointment scheduled 02/02/23.  Ilda Basset A, NP 02/02/2023 2:15 PM

## 2023-12-30 ENCOUNTER — Telehealth: Payer: Self-pay | Admitting: *Deleted

## 2024-02-02 ENCOUNTER — Telehealth: Payer: Self-pay | Admitting: *Deleted

## 2024-02-15 ENCOUNTER — Other Ambulatory Visit: Payer: Self-pay | Admitting: Primary Care

## 2024-02-15 DIAGNOSIS — Z1231 Encounter for screening mammogram for malignant neoplasm of breast: Secondary | ICD-10-CM

## 2024-02-25 ENCOUNTER — Ambulatory Visit
Admission: RE | Admit: 2024-02-25 | Discharge: 2024-02-25 | Disposition: A | Discharge status: Home / Self Care | Payer: Self-pay | Source: Ambulatory Visit | Attending: Primary Care | Admitting: Primary Care

## 2024-02-25 DIAGNOSIS — Z1231 Encounter for screening mammogram for malignant neoplasm of breast: Secondary | ICD-10-CM | POA: Insufficient documentation

## 2024-09-02 ENCOUNTER — Other Ambulatory Visit: Payer: Self-pay

## 2024-09-02 ENCOUNTER — Encounter: Payer: Self-pay | Admitting: Primary Care

## 2024-09-02 DIAGNOSIS — N6311 Unspecified lump in the right breast, upper outer quadrant: Secondary | ICD-10-CM

## 2024-09-20 ENCOUNTER — Ambulatory Visit: Payer: Self-pay | Attending: Obstetrics and Gynecology | Admitting: Nurse Practitioner

## 2024-09-20 VITALS — BP 110/80 | Ht 59.5 in | Wt 137.0 lb

## 2024-09-20 DIAGNOSIS — Z1239 Encounter for other screening for malignant neoplasm of breast: Secondary | ICD-10-CM

## 2024-09-20 NOTE — Progress Notes (Unsigned)
 [name] is a 44 y.o. female who presents to [facility] clinic today with no complaints. She was diagnosed with granulosa cell tumor of the ovary, adult type, stage IA, treated with TAH-BSO, partial omentectomy, biopsies with Dr Joanna Pham at [facility], on 05/18/24. No adjuvant treatment recommended and she is on surveillance. She was referred to genetics. She previously had burning like pain of both breasts, possible right breast lump but now resolved. Complains of frequent crying and mood changes with joint aches and pains.    Pap Smear: Pap smear not completed today. Last Pap smear was 01/26/2024 at [facility] clinic and was normal with negative HPV. Per patient has no history of an abnormal Pap smear. Last Pap smear result is available in Epic.  01/26/24- NILM, HPV negative 05/04/21- NILM, HPV Negative.    Physical exam: Breasts Breasts symmetrical. No skin abnormalities bilateral breasts. No nipple retraction bilateral breasts. No nipple discharge bilateral breasts. Left nipple flat. Right nipple everted. No lymphadenopathy. No lumps palpated bilateral breasts.      MS 3D SCR MAMMO BILAT BR (aka MM) Result Date: 02/29/2024 CLINICAL DATA:  Screening. EXAM: DIGITAL SCREENING BILATERAL MAMMOGRAM WITH TOMOSYNTHESIS AND CAD TECHNIQUE: Bilateral screening digital craniocaudal and mediolateral oblique mammograms were obtained. Bilateral screening digital breast tomosynthesis was performed. The images were evaluated with computer-aided detection. COMPARISON:  Previous exam(s). ACR Breast Density Category c: The breasts are heterogeneously dense, which may obscure small masses. FINDINGS: There are no findings suspicious for malignancy. IMPRESSION: No mammographic evidence of malignancy. A result letter of this screening mammogram will be mailed directly to the patient. RECOMMENDATION: Screening mammogram in one year. (Code:SM-B-01Y) BI-RADS CATEGORY  1: Negative. Electronically Signed   By: Joanna Pham M.D.    On: 02/29/2024 09:40   MS 3D SCR MAMMO BILAT BR (aka MM) Result Date: 02/04/2023 CLINICAL DATA:  Screening. EXAM: DIGITAL SCREENING BILATERAL MAMMOGRAM WITH TOMOSYNTHESIS AND CAD TECHNIQUE: Bilateral screening digital craniocaudal and mediolateral oblique mammograms were obtained. Bilateral screening digital breast tomosynthesis was performed. The images were evaluated with computer-aided detection. COMPARISON:  Previous exam(s). ACR Breast Density Category c: The breasts are heterogeneously dense, which may obscure small masses. FINDINGS: There are no findings suspicious for malignancy. IMPRESSION: No mammographic evidence of malignancy. A result letter of this screening mammogram will be mailed directly to the patient. RECOMMENDATION: Screening mammogram in one year. (Code:SM-B-01Y) BI-RADS CATEGORY  1: Negative. Electronically Signed   By: Joanna  Pham M.D.   On: 02/04/2023 09:33   MS DIGITAL DIAG TOMO BILAT Addendum Date: 05/08/2021 ADDENDUM REPORT: 05/08/2021 15:07 ADDENDUM: Prior mammographic images dated Jan 20, 2012 have been obtained and reviewed. These demonstrate mammographic stability of a prominent RIGHT axillary lymph node, consistent with a benign etiology. There is no significant change to the final impression and recommendation. BI-RADS: 2: Benign. Electronically Signed   By: Joanna Pham M.D.   On: 05/08/2021 15:07   Result Date: 05/08/2021 CLINICAL DATA:  History of RIGHT breast surgery for mastitis. RIGHT pain 1 month ago. Nonfocal bilateral outer breast pain currently. EXAM: DIGITAL DIAGNOSTIC BILATERAL MAMMOGRAM WITH TOMOSYNTHESIS AND CAD; ULTRASOUND RIGHT BREAST LIMITED TECHNIQUE: Bilateral digital diagnostic mammography and breast tomosynthesis was performed. The images were evaluated with computer-aided detection.; Targeted ultrasound examination of the right breast was performed COMPARISON:  None. ACR Breast Density Category c: The breast tissue is heterogeneously dense,  which may obscure small masses. FINDINGS: Spot compression tomosynthesis views were obtained over the area of focal pain in the RIGHT breast.  No suspicious mammographic finding is identified in this area. There is a mildly prominent RIGHT axillary lymph node partially visualized. No suspicious mass, microcalcification, or other finding is identified in the LEFT breast. On physical exam, no suspicious mass is appreciated. Targeted RIGHT breast ultrasound was performed in the area of pain at the upper inner breast. No suspicious solid or cystic mass is identified. No findings to explain the patient's symptoms. Targeted ultrasound was performed of the right axilla. No suspicious axillary lymph nodes are seen. Benign RIGHT axillary lymph nodes are seen with thin smooth cortices and normal echogenic hila IMPRESSION: 1. No mammographic or sonographic evidence of malignancy at the site of painful concern in the RIGHT breast. Any further workup of the patient's symptoms should be based on the clinical assessment. Recommend routine annual screening mammogram in 1 year. 2. No mammographic evidence of malignancy bilaterally. Benign RIGHT axillary lymph nodes. RECOMMENDATION: Screening mammogram in one year.(Code:SM-B-01Y) I have discussed the findings and recommendations with the patient and interpreted. If applicable, a reminder letter will be sent to the patient regarding the next appointment. BI-RADS CATEGORY  2: Benign. Electronically Signed: By: Joanna Pham M.D. On: 05/08/2021 14:54   Pelvic/Bimanual Not indicated   Smoking History: Patient has never smoked    Patient Navigation: Patient education provided. Access to services provided for patient through COMCAST program. [name] from [facility] provided.    Breast and Cervical Cancer Risk Assessment: Patient has family history of breast cancer, known genetic mutations, or radiation treatment to the chest before age 101.  Patient does not have history of cervical dysplasia, immunocompromised, or DES exposure in-utero. She was referred by Dr Joanna Pham at [facility] to genetics for genetic testing given young age of her diagnosis of ovarian cancer and her family history of breast cancer (mother, maternal grandma, 1st maternal cousin, maternal aunt). Blood was drawn last week. Results not yet available.   Risk Scores as of Encounter on 09/20/2024     Alisa           5-year 1.09%   Lifetime 16.7%            Last calculated by Silas, Ansyi K, CMA on 09/20/2024 at  3:15 PM        A: BCCCP exam without pap smear. No evidence of malignancy on exam.  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 No adjuvant treatment recommended.    P:  She no longer requires pap smears unless symptomatic. Cervix has been removed and no history of abnormal paps previously. She will be getting bimanual exams with Dr Joanna Pham for surveillance of her ovarian cancer. She is having significant menopausal symptoms and I recommended she discuss risks and benefits of HRT with Dr Joanna Pham.   Referred patient to the [facility] for a diagnostic mammogram. Appointment scheduled Friday, September 23, 2024, 2:00 PM  I recommended she follow up with [facility] for genetic testing for results. Blood drawn last week.   Dasie Tinnie MATSU, NP 09/20/2024 3:37 PM

## 2024-09-23 ENCOUNTER — Ambulatory Visit
Admission: RE | Admit: 2024-09-23 | Discharge: 2024-09-23 | Disposition: A | Payer: Self-pay | Source: Ambulatory Visit | Attending: Obstetrics and Gynecology | Admitting: Obstetrics and Gynecology

## 2024-09-23 DIAGNOSIS — N6311 Unspecified lump in the right breast, upper outer quadrant: Secondary | ICD-10-CM
